# Patient Record
Sex: Male | Born: 2000 | Race: White | Hispanic: No | Marital: Single | Smoking: Never smoker
Health system: Southern US, Community
[De-identification: ages and names within clinical notes are randomized; demographics above are authoritative.]

## PROBLEM LIST (undated history)

## (undated) DIAGNOSIS — J45909 Unspecified asthma, uncomplicated: Secondary | ICD-10-CM

## (undated) DIAGNOSIS — M419 Scoliosis, unspecified: Secondary | ICD-10-CM

## (undated) DIAGNOSIS — T7840XA Allergy, unspecified, initial encounter: Secondary | ICD-10-CM

## (undated) DIAGNOSIS — F909 Attention-deficit hyperactivity disorder, unspecified type: Secondary | ICD-10-CM

## (undated) HISTORY — DX: Attention-deficit hyperactivity disorder, unspecified type: F90.9

## (undated) HISTORY — DX: Allergy, unspecified, initial encounter: T78.40XA

## (undated) HISTORY — DX: Scoliosis, unspecified: M41.9

## (undated) HISTORY — DX: Unspecified asthma, uncomplicated: J45.909

---
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
## 2022-10-19 ENCOUNTER — Telehealth: Payer: Self-pay

## 2022-10-19 NOTE — Telephone Encounter (Signed)
Pt called in to request a copy of immunization record please. Please call pt when available to pick up please.  Cb#: [phone]
